# Patient Record
Sex: Female | Born: 2000 | Race: White | Hispanic: No | Marital: Single | Smoking: Never smoker
Health system: Southern US, Academic
[De-identification: ages and names within clinical notes are randomized; demographics above are authoritative.]

## PROBLEM LIST (undated history)

## (undated) DIAGNOSIS — E079 Disorder of thyroid, unspecified: Secondary | ICD-10-CM

## (undated) HISTORY — DX: Disorder of thyroid, unspecified: E07.9

---
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
## 2020-07-06 ENCOUNTER — Emergency Department
Admission: EM | Admit: 2020-07-06 | Discharge: 2020-07-06 | Disposition: A | Discharge status: Home / Self Care | Payer: BC Managed Care – PPO | Attending: Emergency Medicine | Admitting: Emergency Medicine

## 2020-07-06 ENCOUNTER — Encounter (HOSPITAL_COMMUNITY): Payer: Self-pay

## 2020-07-06 ENCOUNTER — Other Ambulatory Visit: Payer: Self-pay

## 2020-07-06 DIAGNOSIS — H53142 Visual discomfort, left eye: Secondary | ICD-10-CM

## 2020-07-06 DIAGNOSIS — H5310 Unspecified subjective visual disturbances: Secondary | ICD-10-CM | POA: Insufficient documentation

## 2020-07-06 DIAGNOSIS — R030 Elevated blood-pressure reading, without diagnosis of hypertension: Secondary | ICD-10-CM | POA: Insufficient documentation

## 2020-07-06 DIAGNOSIS — H539 Unspecified visual disturbance: Secondary | ICD-10-CM

## 2020-07-06 DIAGNOSIS — Z888 Allergy status to other drugs, medicaments and biological substances status: Secondary | ICD-10-CM

## 2020-07-06 DIAGNOSIS — Z880 Allergy status to penicillin: Secondary | ICD-10-CM

## 2020-07-06 DIAGNOSIS — H538 Other visual disturbances: Secondary | ICD-10-CM | POA: Insufficient documentation

## 2020-07-06 DIAGNOSIS — H5712 Ocular pain, left eye: Secondary | ICD-10-CM | POA: Insufficient documentation

## 2020-07-06 NOTE — ED Nurses Note (Signed)
Patient discharged, instructions provided by Dr Wonda Olds.

## 2020-07-06 NOTE — ED Provider Notes (Signed)
Department of Emergency Medicine  [facility] Medicine [facility]  07/06/2020      Name: Traci Sermon Chimento  Age and Gender: 20 y.o. female  PCP: Christie Nottingham, DO    Chief Complaint:  Patient presents with     Chief Complaint   Patient presents with   . Eye Pain     LT   . Eye Swelling       HPI    Desira Alessandrini, date of birth 12-18-2000, is a 20 y.o. female who presents to the Emergency Department via Private Vehicle . HPI provided by patient.       Eye Pain  Location:  Left eye  Quality:  Dull and aching  Severity:  Moderate  Onset quality:  Gradual  Duration:  3 days  Timing:  Intermittent  Progression:  Waxing and waning  Chronicity:  New  Context: not contact lens problem    Relieved by:  Closing eye  Worsened by:  Nothing  Ineffective treatments:  None tried  Associated symptoms: no nausea, no vomiting and no weakness       Patient reports blurry vision.  She denies any floaters, no appearing curtain coming down over her eye, no halos around lights and no other visual changes.  She also has a sensation she could not fully close her left eye lat night.  She states her eye pain on Friday he did resolve with rest from phone and closing her eyes.        ROS:   Review of Systems   Constitutional: Negative for chills and fever.   Eyes: Positive for pain.   Respiratory: Negative for shortness of breath.    Cardiovascular: Negative for chest pain.   Gastrointestinal: Negative for abdominal pain, nausea and vomiting.   Genitourinary: Negative for dysuria and frequency.   Musculoskeletal: Positive for joint pain (Left ankle osteomyelitis and states on Bactrim which was changed from Keflex).   Neurological: Negative for weakness.   All other systems reviewed and are negative.      PE:   ED Triage Vitals [07/06/20 0930]   BP (Non-Invasive) (!) 152/84   Heart Rate 91   Respiratory Rate 20   Temperature 36.6 C (97.9 F)   SpO2 96 %   Weight (!) 159 kg (349 lb 10.4 oz)   Height 1.702 m (5\' 7" )     Physical  Exam  Vitals and nursing note reviewed.   Constitutional:       Appearance: Normal appearance.      Comments: Morbidly Obese   HENT:      Head: Normocephalic and atraumatic.      Right Ear: External ear normal.      Left Ear: External ear normal.      Nose: Nose normal.      Mouth/Throat:      Mouth: Mucous membranes are moist.   Eyes:      General: Lids are normal. Lids are everted, no foreign bodies appreciated. Vision grossly intact. No allergic shiner, visual field deficit or scleral icterus.        Right eye: No foreign body, discharge or hordeolum.         Left eye: No foreign body, discharge or hordeolum.      Extraocular Movements: Extraocular movements intact.      Right eye: No nystagmus.      Left eye: No nystagmus.      Conjunctiva/sclera: Conjunctivae normal.      Right eye: Right  conjunctiva is not injected. No chemosis, exudate or hemorrhage.     Left eye: Left conjunctiva is not injected. No chemosis, exudate or hemorrhage.     Pupils: Pupils are equal, round, and reactive to light.      Comments: Visual acuity is 20/30 in the left eye and 20/15 right eye       Cardiovascular:      Rate and Rhythm: Normal rate and regular rhythm.      Pulses: Normal pulses.      Heart sounds: Normal heart sounds.   Pulmonary:      Effort: Pulmonary effort is normal.      Breath sounds: Normal breath sounds.   Abdominal:      General: Abdomen is flat. Bowel sounds are normal.      Palpations: Abdomen is soft.   Musculoskeletal:         General: Normal range of motion.      Cervical back: Normal range of motion and neck supple.      Comments: L walking boot in place   Skin:     General: Skin is warm and dry.   Neurological:      General: No focal deficit present.      Mental Status: She is alert and oriented to person, place, and time. Mental status is at baseline.   Psychiatric:         Mood and Affect: Mood normal.         Behavior: Behavior normal.         Thought Content: Thought content normal.         Judgment:  Judgment normal.             Past Medical History:  Diagnosis     Past Medical History:   Diagnosis Date   . Thyroid disease        Past Surgical History:  Past Surgical History:   Procedure Laterality Date   . Ankle surgery         Family History: No family history on file.    Social History     Social History     Tobacco Use   . Smoking status: Never Smoker   . Smokeless tobacco: Never Used   Vaping Use   . Vaping Use: Never used   Substance Use Topics   . Alcohol use: Never   . Drug use: Never       Social History     Substance and Sexual Activity   Drug Use Never       Christie Nottingham, DO    Allergies   Allergen Reactions   . Iv Contrast    . Penicillins        Diagnostics:    Labs:  Labs Reviewed - No data to display  Labs reviewed and interpreted by me.    MDM:   Differential diagnosis includes, but is not limited to, no head injury or mechanism of injury for retinal detachment.  Patient with elevated blood pressure and may have some visual changes due to uncontrolled blood pressure or migraine. No evidence of iritis or conjunctivitis.  Pain may also be due to pain.  Idiopathic intracranial hypertension/pseudo tumor cerebri is in the differential.  However, patient has no headaches severe visual changes and obesity is her major risk factor.  Patient will have evaluation with Ophthalmology at this time and will defer furhter testing to Dr Nathaneil Canary as he request to evaluate now in his office  ED Course/MDM:      During the patient's stay in the emergency department, the above listed imaging and/or labs were performed to assist with medical decision making and were reviewed by myself when available for review.    Patient rechecked and remained stable throughout remainder of emergency department course.    All questions/concerns addressed, and patient agrees with disposition plan.         Procedures      Medications given during ED stay include:         Clinical Impression:   Encounter Diagnoses   Name  Primary?   . Eye pain, left Yes   . Visual changes    . Eye strain        Disposition: Discharged      I have referred to Ophthalmology, Dr.Sobol, who will see the patient in his office now.    // Despina Pole, MD 07/06/2020 10:29  Department of Emergency Medicine [facility] MEDICINE

## 2020-07-06 NOTE — ED Triage Notes (Addendum)
PT STATES LEFT EYE PAIN AND SWELLING X 1 WEEK  STATES SLIGHT VISION LOSE IN LT EYE X 2 DAYS   SLIGHT HA  STATES EYE DR NOT IN UNTIL Tuesday, HAS NOT BEEN SEEN FOR ISSUE     STATES HAD SURGERY ON LEG 2/3 WKS AGO

## 2020-07-08 ENCOUNTER — Telehealth (HOSPITAL_COMMUNITY): Payer: Self-pay

## 2022-05-23 IMAGING — MR MRI CERVICAL SPINE WITHOUT CONTRAST
3 series · 26 of 48 positions shown · non-contrast
Comparison: none

﻿

Pertinent Hx:    MS.
TECHNIQUE: Sagittal images with STIR and T2 weighting are performed through the cervical spine. Axial images with T2 weighting are performed through the C2-3, C3-4, C4-5, C5-6, C6-7, and C7-T1 disc spaces.

[Series 3: T2 · axial · 4.0mm · 0.62mm/px · z∈[-117,+12]mm · 10 of 30 slices shown (1 of 2)]
[im 2/30]
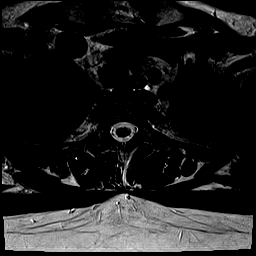
[im 6/30]
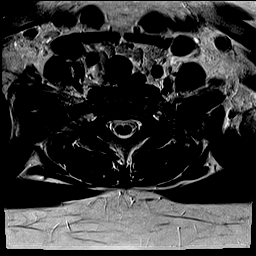
[im 9/30]
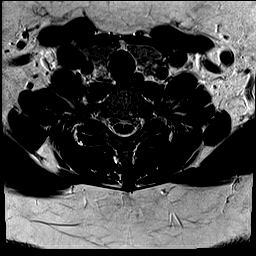
[im 13/30]
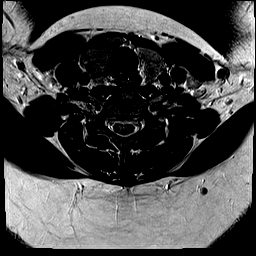
[im 16/30]
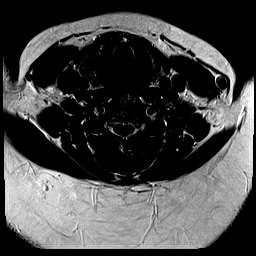
[im 17/30]
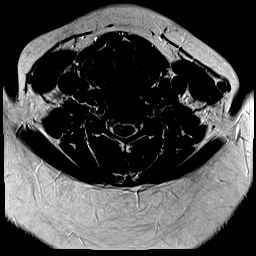
[im 21/30]
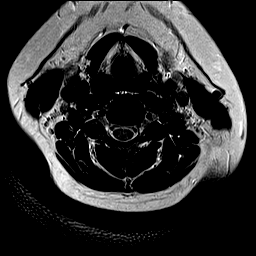
[im 24/30]
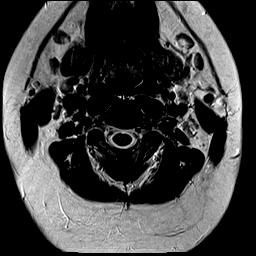
[im 26/30]
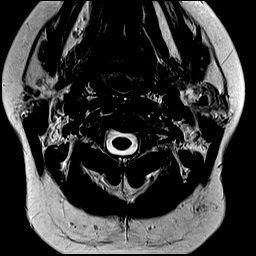
[im 28/30]
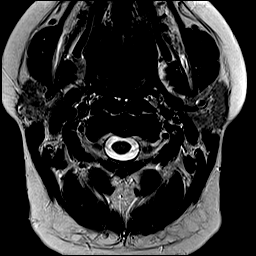

[Series 4: STIR · sagittal · 3.0mm · 0.39mm/px · 7 of 15 slices shown]
[im 1/15]
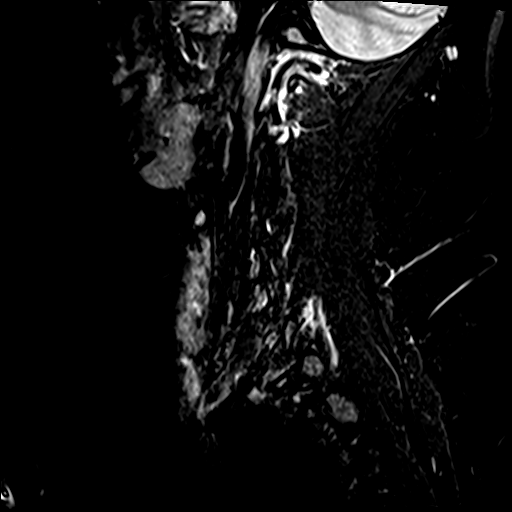
[im 3/15]
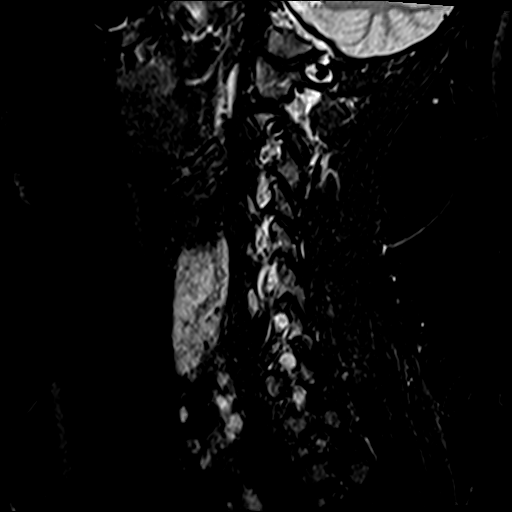
[im 4/15]
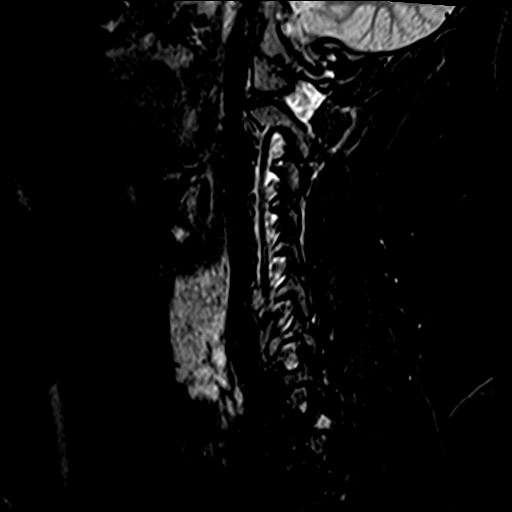
[im 7/15]
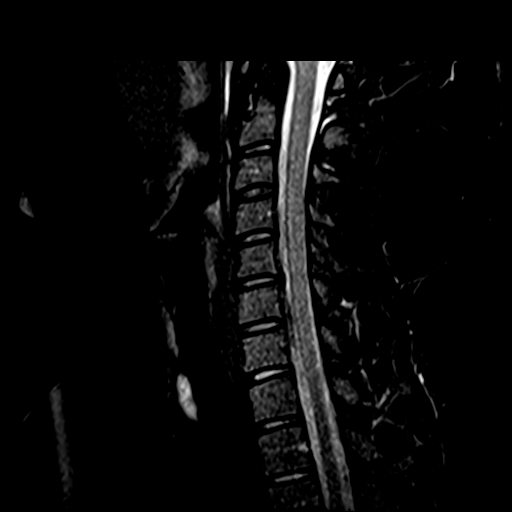
[im 8/15]
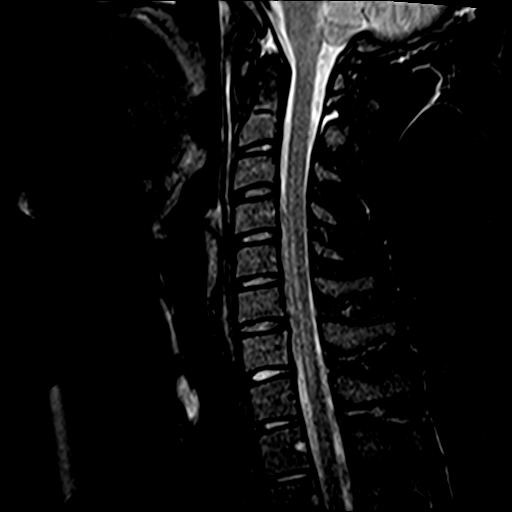
[im 11/15]
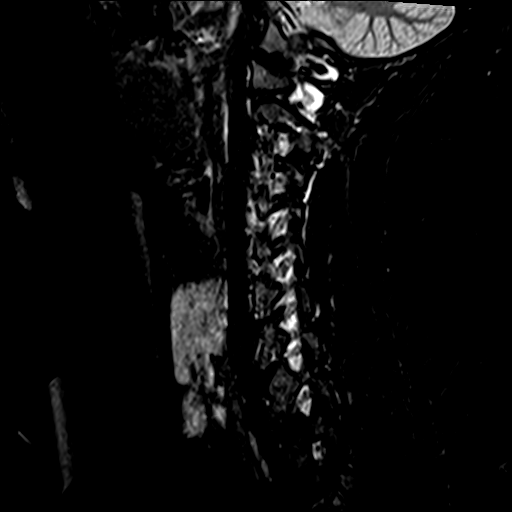
[im 13/15]
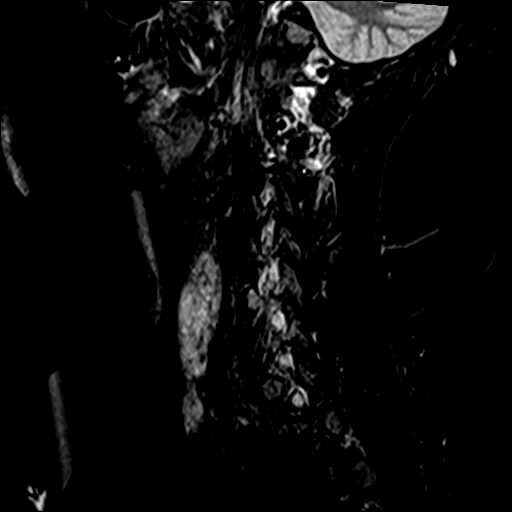

[Series 5: T2 · sagittal · 3.0mm · 0.39mm/px · 9 of 15 slices shown (2 of 2)]
[im 1/15]
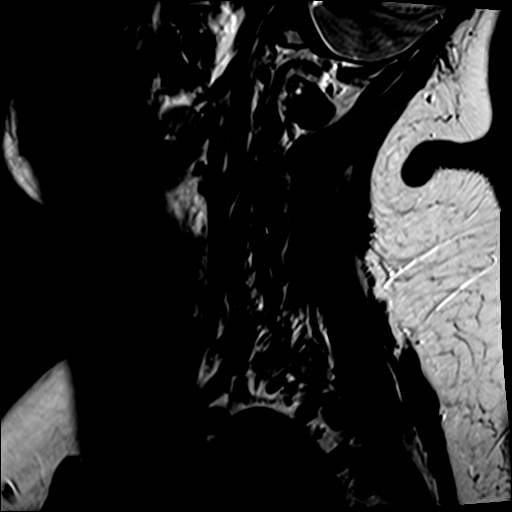
[im 3/15]
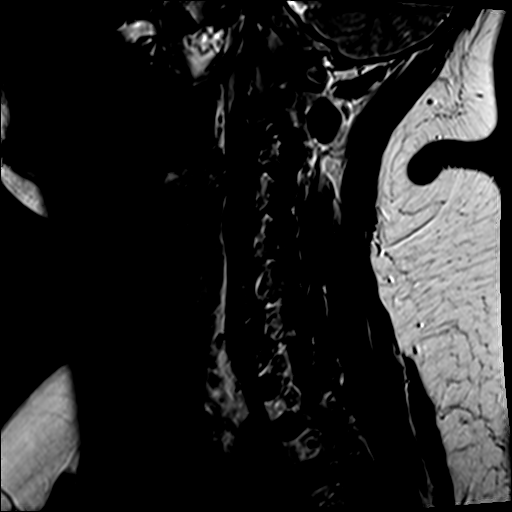
[im 4/15]
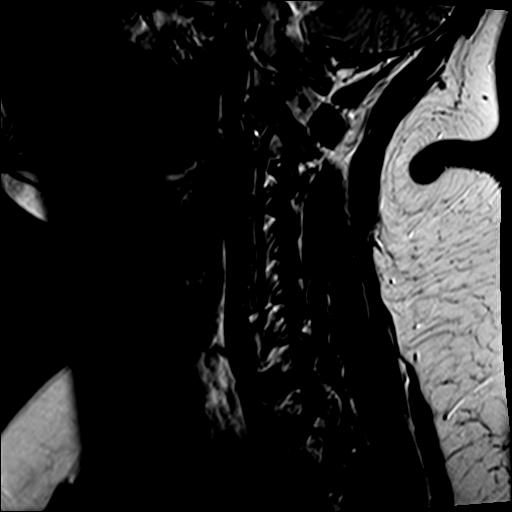
[im 7/15]
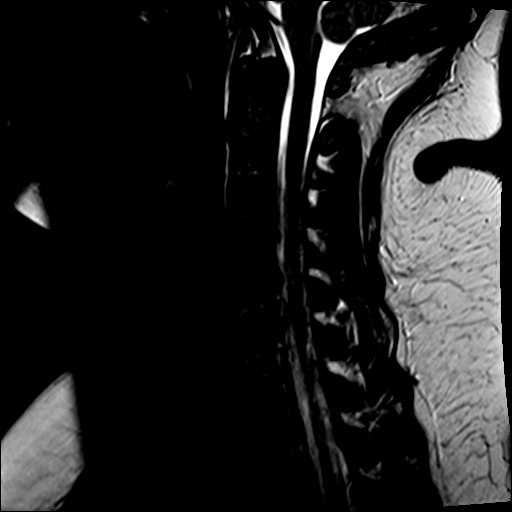
[im 8/15]
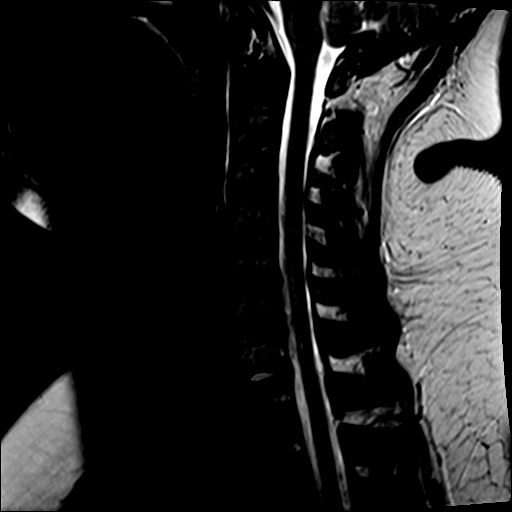
[im 11/15]
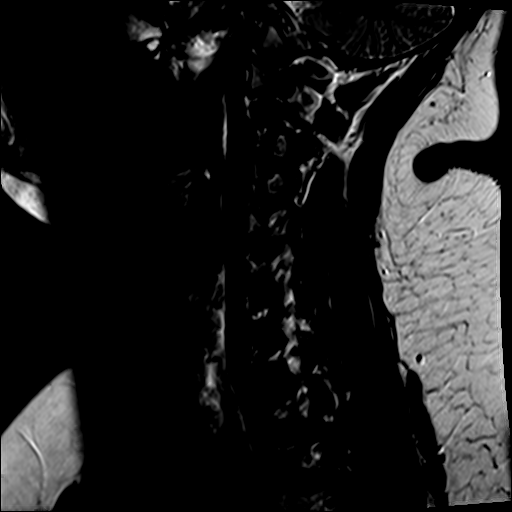
[im 12/15]
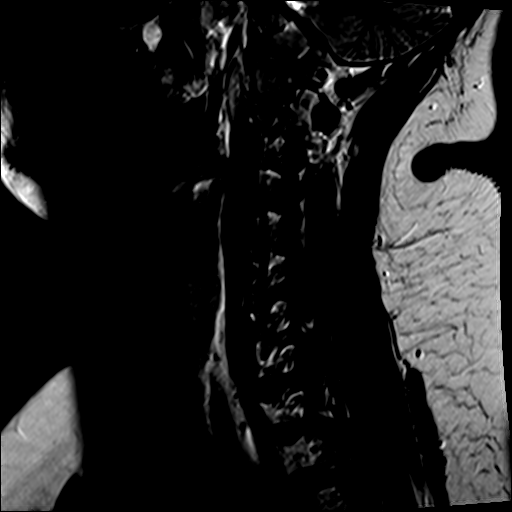
[im 13/15]
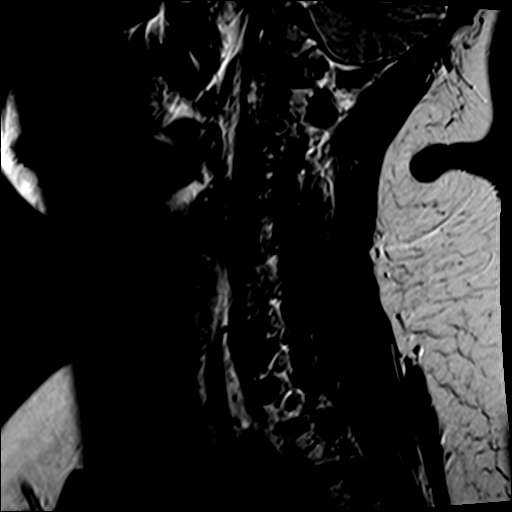
[im 15/15]
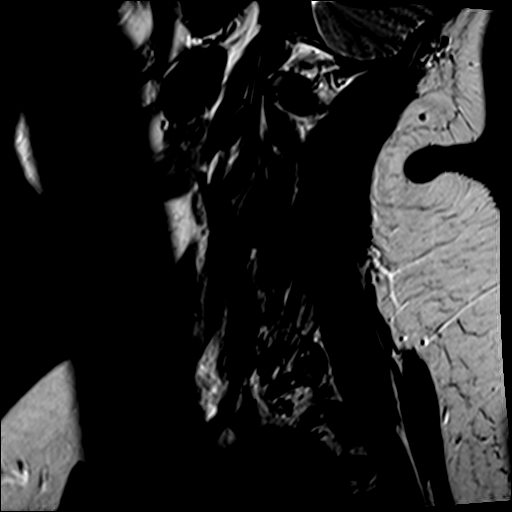

[26 of 48 positions shown; findings below may reference images not displayed]

FINDINGS: C2 through C6 is normal. 

At C6-C7, there is a very small focus of bright signal just to the left of midline that barely effaces the subarachnoid space resulting in mild midline left neural canal stenosis.  Image 20 series 3.   

No abnormalities are otherwise seen in the cervical cord.
IMPRESSION: Minimal abnormal due to a very small midline and left-sided disc herniation at C6-C7 which produces minimal canal or foraminal stenosis.  No abnormal signal is present within the cervical cord.

## 2022-05-23 IMAGING — MR MRI BRAIN W/O CONTRAST
5 of 7 series · 31 of 48 positions shown · non-contrast
Comparison: A prior scan dated July 31, 2021 is reviewed and compared.

﻿

Pertinent Hx:    MS.
TECHNIQUE: T1, T2, and FLAIR weighted axials, T2 coronals, FLAIR sagittals, and diffusion weighted images are performed through the brain.

[Series 2: FLAIR · sagittal · 2.5mm · 0.47mm/px · 11 of 45 slices shown (1 of 2)]
[im 1/45]
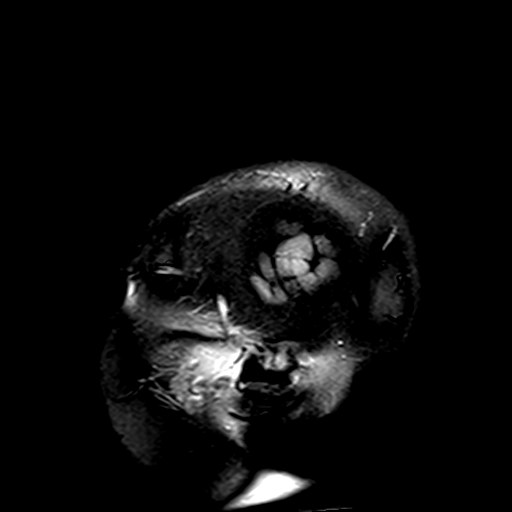
[im 5/45]
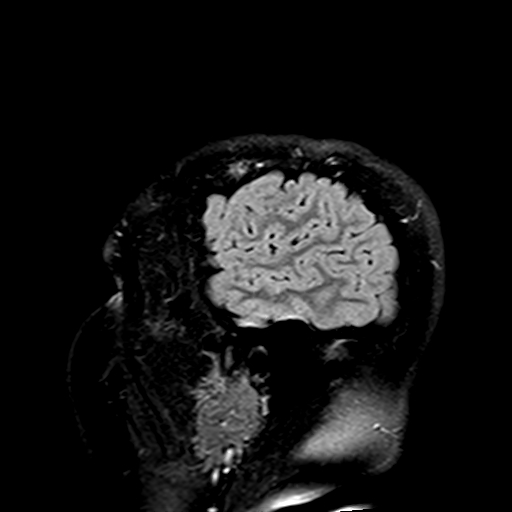
[im 9/45]
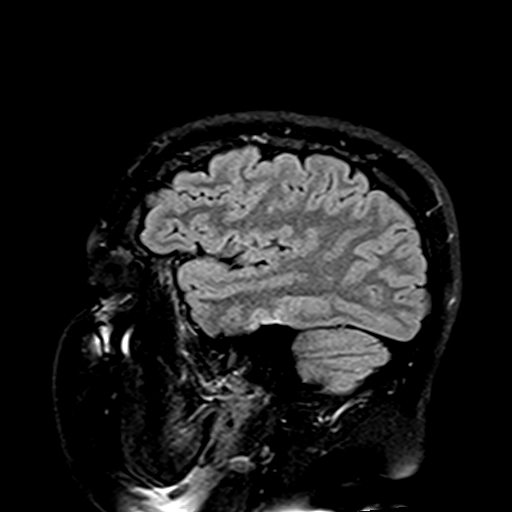
[im 14/45]
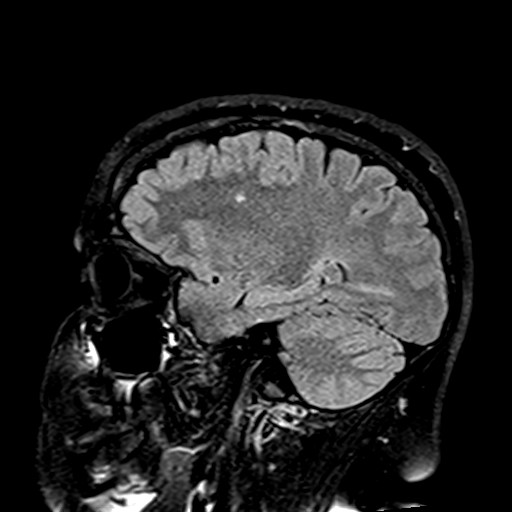
[im 18/45]
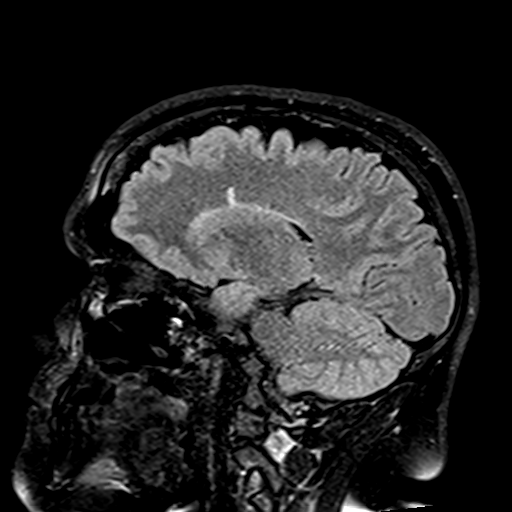
[im 23/45]
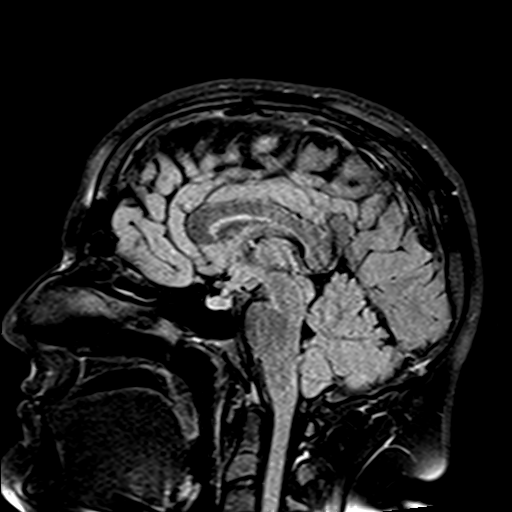
[im 27/45]
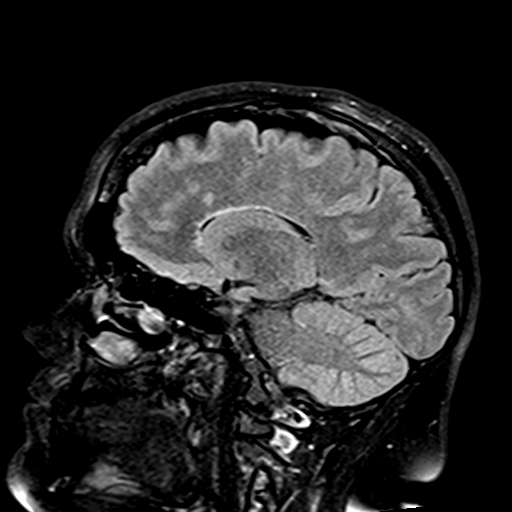
[im 31/45]
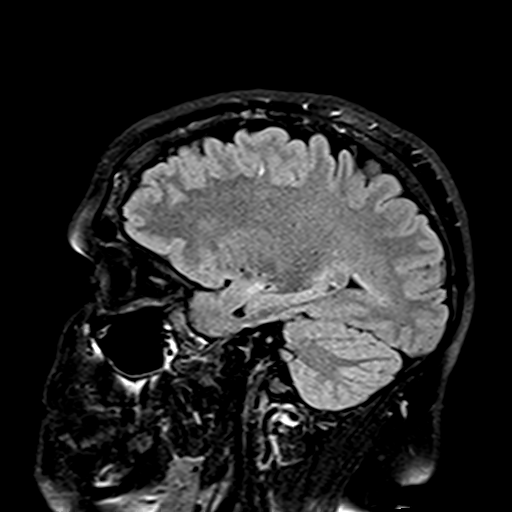
[im 36/45]
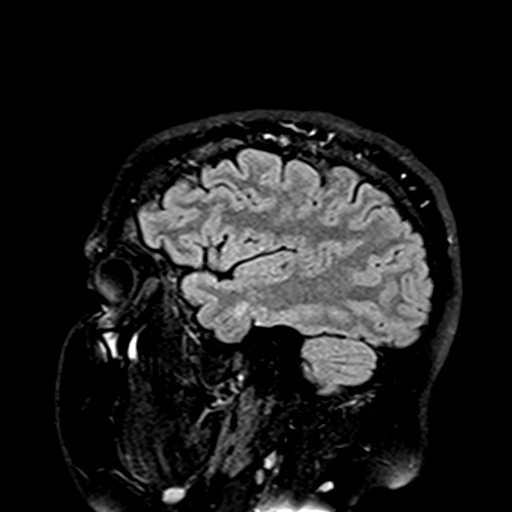
[im 40/45]
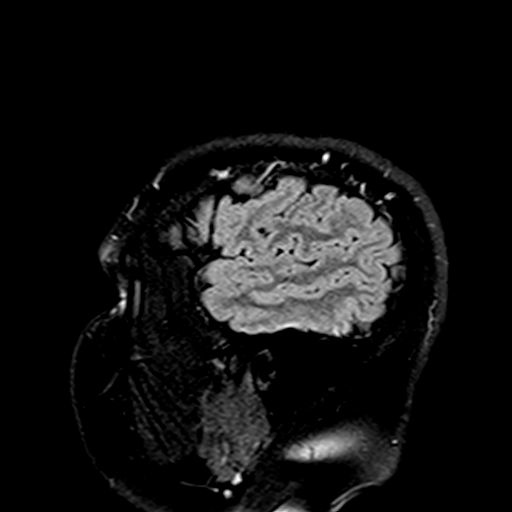
[im 45/45]
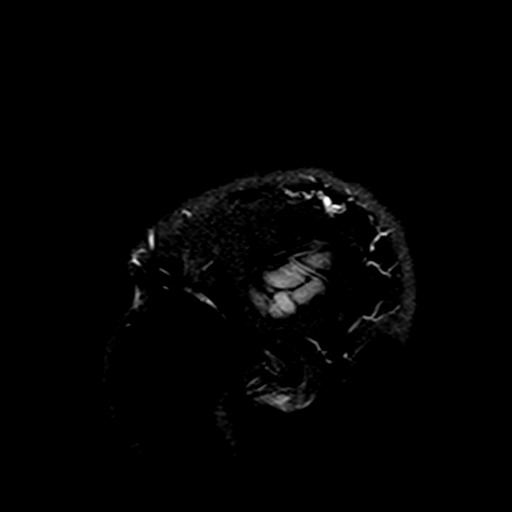

[Series 3: T2 · coronal · 5.0mm · 0.57mm/px · 7 of 28 slices shown (1 of 2)]
[im 1/28]
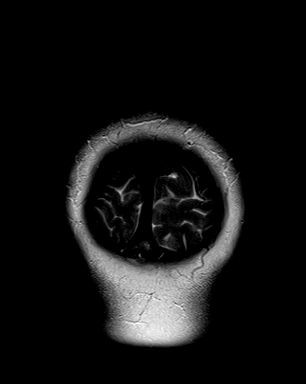
[im 5/28]
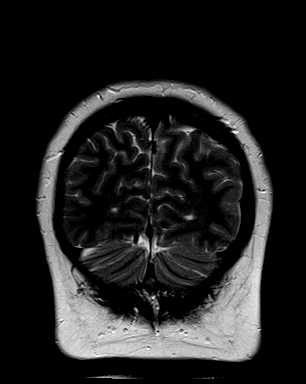
[im 10/28]
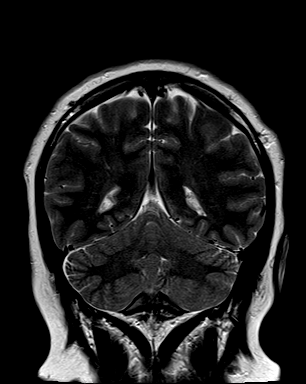
[im 14/28]
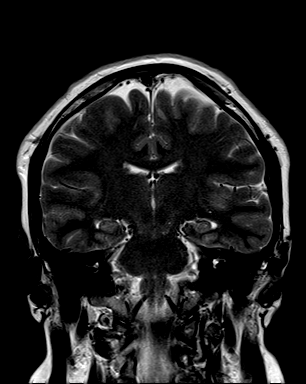
[im 19/28]
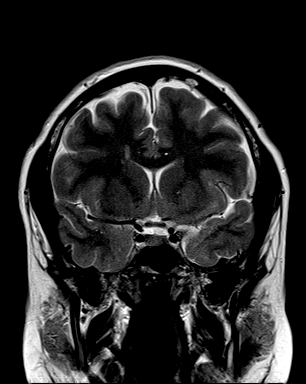
[im 23/28]
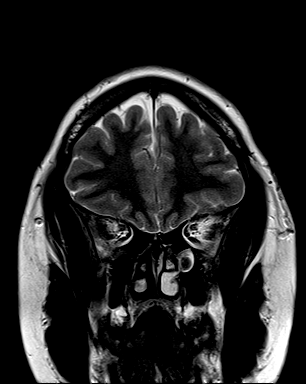
[im 28/28]
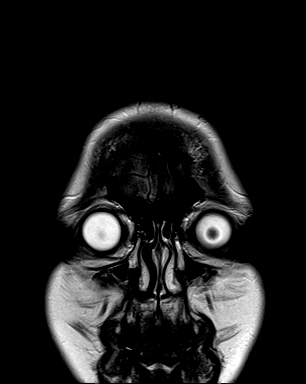

[Series 4: T2 · axial · 5.0mm · 0.57mm/px · z∈[-25,+110]mm · 6 of 26 slices shown (2 of 2)]
[im 1/26]
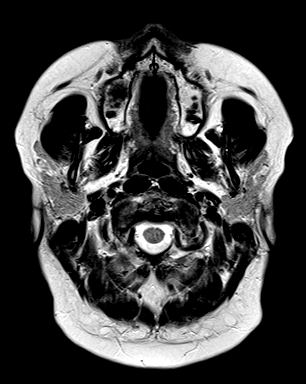
[im 6/26]
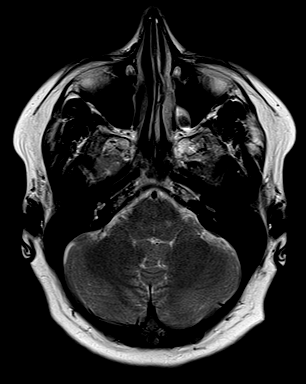
[im 11/26]
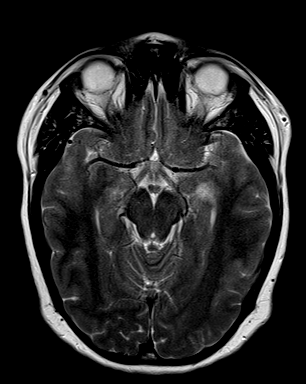
[im 16/26]
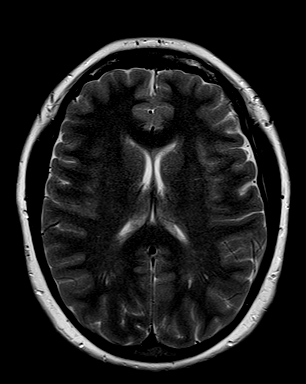
[im 21/26]
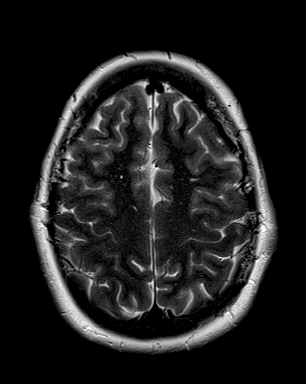
[im 26/26]
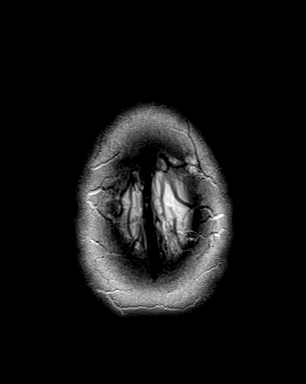

[Series 5: T1 · axial · 5.0mm · 0.69mm/px · 1 of 26 slices shown]
[im 1/26]
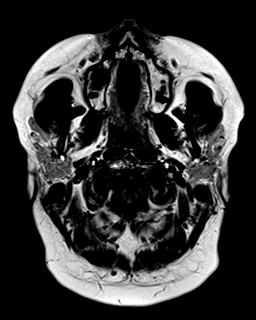

[Series 6: FLAIR · axial · 5.0mm · 0.69mm/px · z∈[-25,+110]mm · 6 of 26 slices shown (2 of 2)]
[im 1/26]
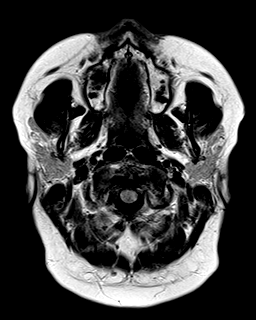
[im 6/26]
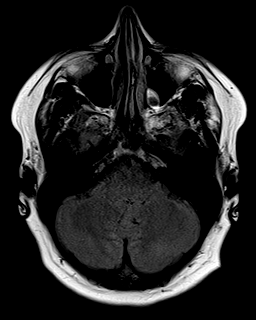
[im 11/26]
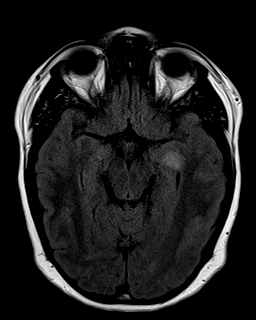
[im 16/26]
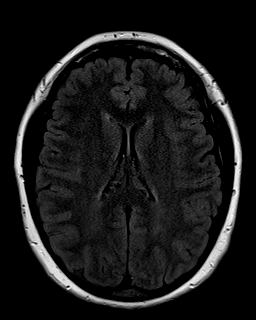
[im 21/26]
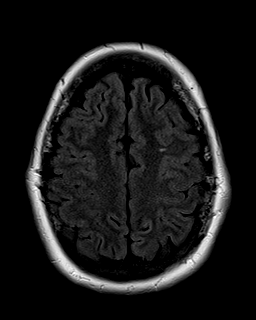
[im 26/26]
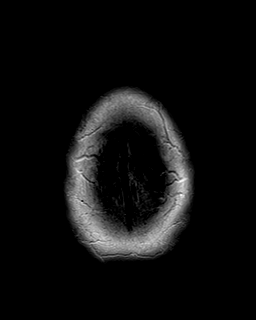

[31 of 48 positions shown; findings below may reference images not displayed]

FINDINGS: There is a new 1 cm lesion in diameter lesion adjacent to the temporal horn of the left lateral ventricle that was not present on the prior scan of 09/30/2021.  Approximately 5-6 other lesions all smaller than .5 cm in size were present in the centrum semiovale or higher brain, image 18 series 6 and one alone the midline, image 21 series 6, were all present on prior scans.  There is no evidence of PML.  There is at least one lesion involving the corpus callosum supporting the diagnosis of MS.
IMPRESSION: This is an abnormal study with a new prominent lesion in the center of the left temporal lobe adjacent to the temporal horn of the left lateral ventricle.  Five other smaller lesions were present on prior scans unchanged when compared on today’s scan.
